# Patient Record
Sex: Female | Born: 1995 | Race: Black or African American | Hispanic: No | Marital: Single | Smoking: Never smoker
Health system: Southern US, Community
[De-identification: ages and names within clinical notes are randomized; demographics above are authoritative.]

---
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
## 2019-01-20 ENCOUNTER — Other Ambulatory Visit: Payer: Self-pay

## 2019-01-20 DIAGNOSIS — Z20822 Contact with and (suspected) exposure to covid-19: Secondary | ICD-10-CM

## 2019-01-23 LAB — NOVEL CORONAVIRUS, NAA: SARS-CoV-2, NAA: NOT DETECTED
